# Patient Record
Sex: Male | Born: 1970 | Race: White | Hispanic: No | Marital: Married | Smoking: Current some day smoker
Health system: Southern US, Community
[De-identification: ages and names within clinical notes are randomized; demographics above are authoritative.]

## PROBLEM LIST (undated history)

## (undated) DIAGNOSIS — R011 Cardiac murmur, unspecified: Secondary | ICD-10-CM

## (undated) HISTORY — PX: HERNIA REPAIR: SHX51

## (undated) HISTORY — DX: Cardiac murmur, unspecified: R01.1

## (undated) HISTORY — PX: WISDOM TOOTH EXTRACTION: SHX21

---

## 2006-05-01 ENCOUNTER — Ambulatory Visit (HOSPITAL_BASED_OUTPATIENT_CLINIC_OR_DEPARTMENT_OTHER): Admission: RE | Admit: 2006-05-01 | Discharge: 2006-05-01 | Payer: Self-pay | Admitting: Surgery

## 2020-06-12 DIAGNOSIS — Z125 Encounter for screening for malignant neoplasm of prostate: Secondary | ICD-10-CM | POA: Diagnosis not present

## 2020-06-12 DIAGNOSIS — Z0000 Encounter for general adult medical examination without abnormal findings: Secondary | ICD-10-CM | POA: Diagnosis not present

## 2020-06-12 DIAGNOSIS — E785 Hyperlipidemia, unspecified: Secondary | ICD-10-CM | POA: Diagnosis not present

## 2020-06-19 DIAGNOSIS — N390 Urinary tract infection, site not specified: Secondary | ICD-10-CM | POA: Diagnosis not present

## 2020-06-19 DIAGNOSIS — Z1212 Encounter for screening for malignant neoplasm of rectum: Secondary | ICD-10-CM | POA: Diagnosis not present

## 2020-06-19 DIAGNOSIS — E785 Hyperlipidemia, unspecified: Secondary | ICD-10-CM | POA: Diagnosis not present

## 2020-06-19 DIAGNOSIS — Z0000 Encounter for general adult medical examination without abnormal findings: Secondary | ICD-10-CM | POA: Diagnosis not present

## 2020-06-19 DIAGNOSIS — R82998 Other abnormal findings in urine: Secondary | ICD-10-CM | POA: Diagnosis not present

## 2020-07-06 ENCOUNTER — Encounter: Payer: Self-pay | Admitting: Gastroenterology

## 2020-08-24 ENCOUNTER — Ambulatory Visit (AMBULATORY_SURGERY_CENTER): Payer: Self-pay | Admitting: *Deleted

## 2020-08-24 ENCOUNTER — Other Ambulatory Visit: Payer: Self-pay

## 2020-08-24 VITALS — Ht 71.0 in | Wt 187.0 lb

## 2020-08-24 DIAGNOSIS — Z1211 Encounter for screening for malignant neoplasm of colon: Secondary | ICD-10-CM

## 2020-08-24 MED ORDER — SUTAB 1479-225-188 MG PO TABS: 1.0000 | ORAL_TABLET | ORAL | 0 refills | Status: DC

## 2020-08-24 NOTE — Progress Notes (Signed)
Patient is here in-person for PV. Patient denies any allergies to eggs or soy. Patient denies any problems with anesthesia/sedation. Patient denies any oxygen use at home. Patient denies taking any diet/weight loss medications or blood thinners. Patient is not being treated for MRSA or C-diff. Patient is aware of our care-partner policy and LJQGB-20 safety protocol. EMMI education assisgned to the patient for the procedure, pt is aware.   COVID-19 vaccines completed on 01/2020, per patient.   Prep Prescription coupon given to the patient.

## 2020-08-28 ENCOUNTER — Encounter: Payer: Self-pay | Admitting: Gastroenterology

## 2020-09-07 ENCOUNTER — Encounter: Payer: Self-pay | Admitting: Gastroenterology

## 2020-09-24 ENCOUNTER — Other Ambulatory Visit: Payer: Self-pay | Admitting: Internal Medicine

## 2020-09-24 DIAGNOSIS — E785 Hyperlipidemia, unspecified: Secondary | ICD-10-CM

## 2020-09-28 ENCOUNTER — Ambulatory Visit (AMBULATORY_SURGERY_CENTER): Payer: BC Managed Care – PPO | Admitting: Gastroenterology

## 2020-09-28 ENCOUNTER — Encounter: Payer: Self-pay | Admitting: Gastroenterology

## 2020-09-28 ENCOUNTER — Other Ambulatory Visit: Payer: Self-pay

## 2020-09-28 VITALS — BP 126/78 | HR 54 | Temp 97.1°F | Resp 18 | Ht 71.0 in | Wt 187.0 lb

## 2020-09-28 DIAGNOSIS — Z1211 Encounter for screening for malignant neoplasm of colon: Secondary | ICD-10-CM | POA: Diagnosis not present

## 2020-09-28 DIAGNOSIS — D127 Benign neoplasm of rectosigmoid junction: Secondary | ICD-10-CM

## 2020-09-28 DIAGNOSIS — K635 Polyp of colon: Secondary | ICD-10-CM | POA: Diagnosis not present

## 2020-09-28 DIAGNOSIS — D123 Benign neoplasm of transverse colon: Secondary | ICD-10-CM

## 2020-09-28 MED ORDER — SODIUM CHLORIDE 0.9 % IV SOLN: 500.0000 mL | Freq: Once | INTRAVENOUS | Status: DC

## 2020-09-28 NOTE — Progress Notes (Signed)
Called to room to assist during endoscopic procedure.  Patient ID and intended procedure confirmed with present staff. Received instructions for my participation in the procedure from the performing physician.  

## 2020-09-28 NOTE — Progress Notes (Signed)
[facility]  Pt's states no medical or surgical changes since previsit or office visit.  

## 2020-09-28 NOTE — Op Note (Signed)
[place] Patient Name: Ronald Holden Procedure Date: 09/28/2020 1:56 PM MRN: [id number] Endoscopist: Remo Lipps P. Havery Moros , MD Age: 49 Referring MD:  Date of Birth: 1971/08/24 Gender: Male Account #: [id number] Procedure:                Colonoscopy Indications:              Screening for colorectal malignant neoplasm, This                            is the patient's first colonoscopy Medicines:                Monitored Anesthesia Care Procedure:                Pre-Anesthesia Assessment:                           - Prior to the procedure, a History and Physical                            was performed, and patient medications and                            allergies were reviewed. The patient's tolerance of                            previous anesthesia was also reviewed. The risks                            and benefits of the procedure and the sedation                            options and risks were discussed with the patient.                            All questions were answered, and informed consent                            was obtained. Prior Anticoagulants: The patient has                            taken no previous anticoagulant or antiplatelet                            agents. ASA Grade Assessment: I - A normal, healthy                            patient. After reviewing the risks and benefits,                            the patient was deemed in satisfactory condition to                            undergo the procedure.  After obtaining informed consent, the colonoscope                            was passed under direct vision. Throughout the                            procedure, the patient's blood pressure, pulse, and                            oxygen saturations were monitored continuously. The                            Colonoscope was introduced through the anus and                            advanced to the the cecum,  identified by                            appendiceal orifice and ileocecal valve. The                            colonoscopy was performed without difficulty. The                            patient tolerated the procedure well. The quality                            of the bowel preparation was adequate. The                            ileocecal valve, appendiceal orifice, and rectum                            were photographed. Scope In: 2:02:41 PM Scope Out: 2:30:10 PM Scope Withdrawal Time: 0 hours 18 minutes 10 seconds  Total Procedure Duration: 0 hours 27 minutes 29 seconds  Findings:                 The perianal and digital rectal examinations were                            normal.                           The colon was extremely tortuous with angulated                            turns in the left colon.                           Two flat polyps were found in the hepatic flexure.                            The polyps were 3 to 4 mm in size. These polyps  were removed with a cold snare. Resection and                            retrieval were complete.                           A 3 mm polyp was found in the transverse colon. The                            polyp was sessile. The polyp was removed with a                            cold snare. Resection and retrieval were complete.                           A 3 mm polyp was found in the recto-sigmoid colon.                            The polyp was sessile. The polyp was removed with a                            cold snare. Resection and retrieval were complete.                           Internal hemorrhoids were found during                            retroflexion. The hemorrhoids were small.                           The exam was otherwise without abnormality. Complications:            No immediate complications. Estimated blood loss:                            Minimal. Estimated Blood Loss:     Estimated  blood loss was minimal. Impression:               - Tortuous colon.                           - Two 3 to 4 mm polyps at the hepatic flexure,                            removed with a cold snare. Resected and retrieved.                           - One 3 mm polyp in the transverse colon, removed                            with a cold snare. Resected and retrieved.                           - One 3 mm polyp at the recto-sigmoid  colon,                            removed with a cold snare. Resected and retrieved.                           - Internal hemorrhoids.                           - The examination was otherwise normal. Recommendation:           - Patient has a contact number available for                            emergencies. The signs and symptoms of potential                            delayed complications were discussed with the                            patient. Return to normal activities tomorrow.                            Written discharge instructions were provided to the                            patient.                           - Resume previous diet.                           - Continue present medications.                           - Await pathology results. Remo Lipps P. Chales Pelissier, MD 09/28/2020 2:34:26 PM This report has been signed electronically.

## 2020-09-28 NOTE — Patient Instructions (Signed)
Information on polyps and hemorrhoids given to you today.  Await pathology results.  Resume previous diet and medications.  YOU HAD AN ENDOSCOPIC PROCEDURE TODAY AT [facility]:   Refer to the procedure report that was given to you for any specific questions about what was found during the examination.  If the procedure report does not answer your questions, please call your gastroenterologist to clarify.  If you requested that your care partner not be given the details of your procedure findings, then the procedure report has been included in a sealed envelope for you to review at your convenience later.  YOU SHOULD EXPECT: Some feelings of bloating in the abdomen. Passage of more gas than usual.  Walking can help get rid of the air that was put into your GI tract during the procedure and reduce the bloating. If you had a lower endoscopy (such as a colonoscopy or flexible sigmoidoscopy) you may notice spotting of blood in your stool or on the toilet paper. If you underwent a bowel prep for your procedure, you may not have a normal bowel movement for a few days.  Please Note:  You might notice some irritation and congestion in your nose or some drainage.  This is from the oxygen used during your procedure.  There is no need for concern and it should clear up in a day or so.  SYMPTOMS TO REPORT IMMEDIATELY:   Following lower endoscopy (colonoscopy or flexible sigmoidoscopy):  Excessive amounts of blood in the stool  Significant tenderness or worsening of abdominal pains  Swelling of the abdomen that is new, acute  Fever of 100F or higher  F For urgent or emergent issues, a gastroenterologist can be reached at any hour by calling [phone]. Do not use MyChart messaging for urgent concerns.    DIET:  We do recommend a small meal at first, but then you may proceed to your regular diet.  Drink plenty of fluids but you should avoid alcoholic beverages for 24  hours.  ACTIVITY:  You should plan to take it easy for the rest of today and you should NOT DRIVE or use heavy machinery until tomorrow (because of the sedation medicines used during the test).    FOLLOW UP: Our staff will call the number listed on your records 48-72 hours following your procedure to check on you and address any questions or concerns that you may have regarding the information given to you following your procedure. If we do not reach you, we will leave a message.  We will attempt to reach you two times.  During this call, we will ask if you have developed any symptoms of COVID 19. If you develop any symptoms (ie: fever, flu-like symptoms, shortness of breath, cough etc.) before then, please call [phone].  If you test positive for Covid 19 in the 2 weeks post procedure, please call and report this information to [place].    If any biopsies were taken you will be contacted by phone or by letter within the next 1-3 weeks.  Please call us at [phone] if you have not heard about the biopsies in 3 weeks.    SIGNATURES/CONFIDENTIALITY: You and/or your care partner have signed paperwork which will be entered into your electronic medical record.  These signatures attest to the fact that that the information above on your After Visit Summary has been reviewed and is understood.  Full responsibility of the confidentiality of this discharge information lies with you and/or  your care-partner. 

## 2020-09-28 NOTE — Progress Notes (Signed)
Report given to PACU, vss 

## 2020-09-30 ENCOUNTER — Telehealth: Payer: Self-pay

## 2020-09-30 ENCOUNTER — Telehealth: Payer: Self-pay | Admitting: *Deleted

## 2020-09-30 NOTE — Telephone Encounter (Signed)
First follow up call made. No opportunity to leave message as the mailbox is full.

## 2020-09-30 NOTE — Telephone Encounter (Signed)
  Follow up Call-  Call back number 09/28/2020  Post procedure Call Back phone  # [phone]  Permission to leave phone message Yes  Some recent data might be hidden     Patient questions:  Do you have a fever, pain , or abdominal swelling? No. Pain Score  0 *  Have you tolerated food without any problems? Yes.    Have you been able to return to your normal activities? Yes.    Do you have any questions about your discharge instructions: Diet   No. Medications  No. Follow up visit  No.  Do you have questions or concerns about your Care? No.  Actions: * If pain score is 4 or above: 1. No action needed, pain <4.Have you developed a fever since your procedure? no  2.   Have you had an respiratory symptoms (SOB or cough) since your procedure? no  3.   Have you tested positive for COVID 19 since your procedure no  4.   Have you had any family members/close contacts diagnosed with the COVID 19 since your procedure?  no   If yes to any of these questions please route to Joylene John, RN and Joella Prince, RN

## 2020-10-07 ENCOUNTER — Encounter: Payer: Self-pay | Admitting: Gastroenterology

## 2020-10-14 ENCOUNTER — Ambulatory Visit
Admission: RE | Admit: 2020-10-14 | Discharge: 2020-10-14 | Disposition: A | Discharge status: Home / Self Care | Payer: No Typology Code available for payment source | Source: Ambulatory Visit | Attending: Internal Medicine | Admitting: Internal Medicine

## 2020-10-14 DIAGNOSIS — E785 Hyperlipidemia, unspecified: Secondary | ICD-10-CM

## 2020-10-14 DIAGNOSIS — E7800 Pure hypercholesterolemia, unspecified: Secondary | ICD-10-CM | POA: Diagnosis not present

## 2020-10-28 DIAGNOSIS — D225 Melanocytic nevi of trunk: Secondary | ICD-10-CM | POA: Diagnosis not present

## 2020-10-28 DIAGNOSIS — D2361 Other benign neoplasm of skin of right upper limb, including shoulder: Secondary | ICD-10-CM | POA: Diagnosis not present

## 2020-10-28 DIAGNOSIS — D2262 Melanocytic nevi of left upper limb, including shoulder: Secondary | ICD-10-CM | POA: Diagnosis not present

## 2020-10-28 DIAGNOSIS — D2261 Melanocytic nevi of right upper limb, including shoulder: Secondary | ICD-10-CM | POA: Diagnosis not present

## 2021-02-15 DIAGNOSIS — F1120 Opioid dependence, uncomplicated: Secondary | ICD-10-CM | POA: Diagnosis not present

## 2021-03-03 DIAGNOSIS — F1120 Opioid dependence, uncomplicated: Secondary | ICD-10-CM | POA: Diagnosis not present

## 2021-03-23 DIAGNOSIS — F1120 Opioid dependence, uncomplicated: Secondary | ICD-10-CM | POA: Diagnosis not present

## 2021-04-20 DIAGNOSIS — F1120 Opioid dependence, uncomplicated: Secondary | ICD-10-CM | POA: Diagnosis not present

## 2021-04-27 DIAGNOSIS — F1120 Opioid dependence, uncomplicated: Secondary | ICD-10-CM | POA: Diagnosis not present

## 2021-05-04 DIAGNOSIS — F1120 Opioid dependence, uncomplicated: Secondary | ICD-10-CM | POA: Diagnosis not present

## 2021-05-11 DIAGNOSIS — F1120 Opioid dependence, uncomplicated: Secondary | ICD-10-CM | POA: Diagnosis not present

## 2021-06-15 DIAGNOSIS — Z125 Encounter for screening for malignant neoplasm of prostate: Secondary | ICD-10-CM | POA: Diagnosis not present

## 2021-06-15 DIAGNOSIS — E785 Hyperlipidemia, unspecified: Secondary | ICD-10-CM | POA: Diagnosis not present

## 2021-06-25 DIAGNOSIS — K30 Functional dyspepsia: Secondary | ICD-10-CM | POA: Diagnosis not present

## 2021-06-25 DIAGNOSIS — Z0000 Encounter for general adult medical examination without abnormal findings: Secondary | ICD-10-CM | POA: Diagnosis not present

## 2021-11-12 IMAGING — CT CT CARDIAC CORONARY ARTERY CALCIUM SCORE
4 series · 12 of 20 positions shown, 13 images · non-contrast
Comparison: None.

CLINICAL DATA: 49-year-old white male with high cholesterol

EXAM:
CT CARDIAC CORONARY ARTERY CALCIUM SCORE
TECHNIQUE: Non-contrast imaging through the heart was performed using
prospective ECG gating. Image post processing was performed on an
independent workstation, allowing for quantitative analysis of the
heart and coronary arteries. Note that this exam targets the heart
and the chest was not imaged in its entirety.

[Series 2: calcium scoring 2.00 qr36 bestdiast 72% hrt calciu · axial · 0.46mm/px · z∈[+1615,+1663]mm · 3 of 50 slices shown]
[im 13/50  vessel]
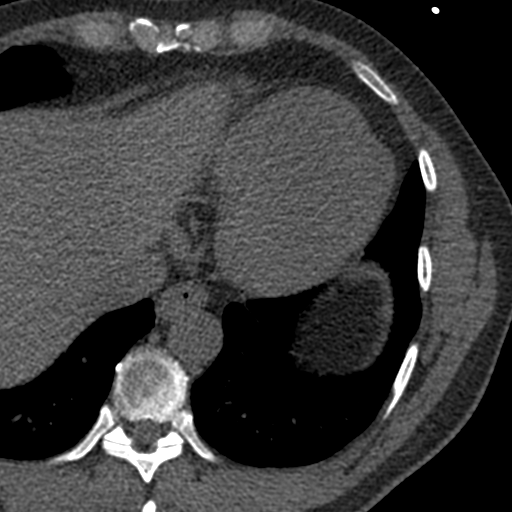
[im 25/50  vessel]
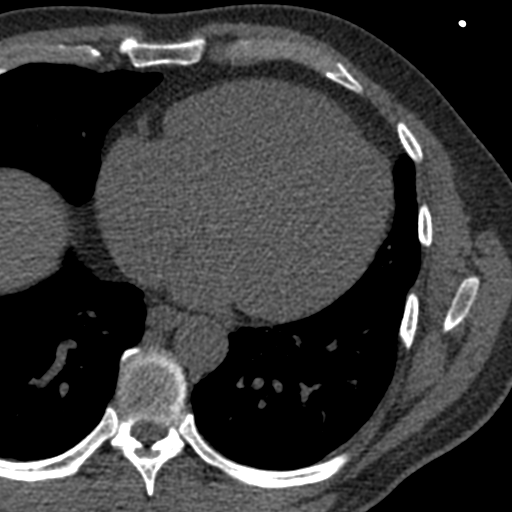
[im 37/50  vessel]
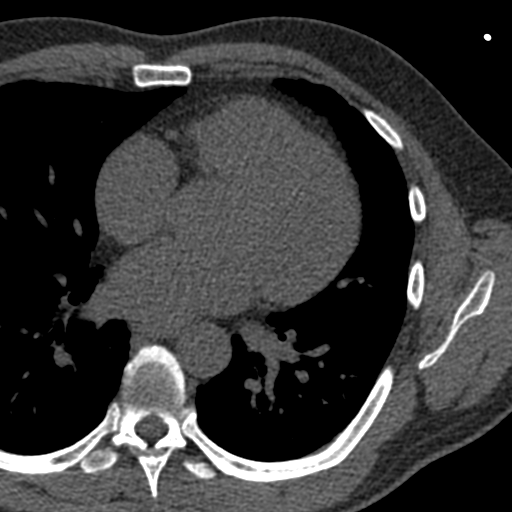

[Series 3: calcium scoring 2.00 qr36 bestdiast 68% hrt calciu · axial · 0.38mm/px · z∈[+1594,+1662]mm · 3 of 70 slices shown, 4 images]
[im 18/70  vessel]
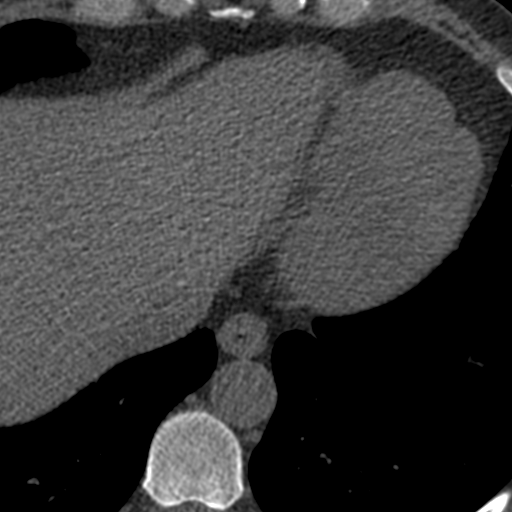
[im 18/70  lung]
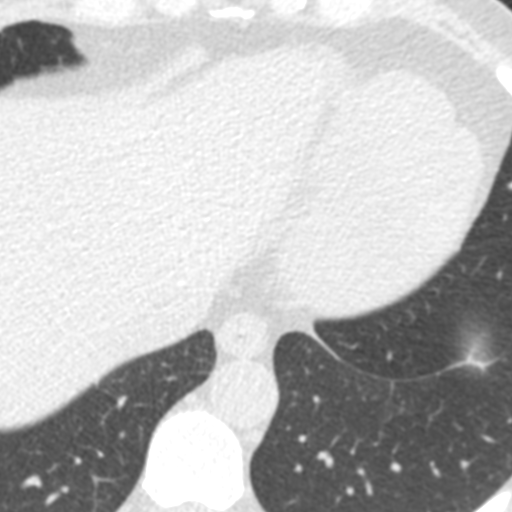
[im 35/70  vessel]
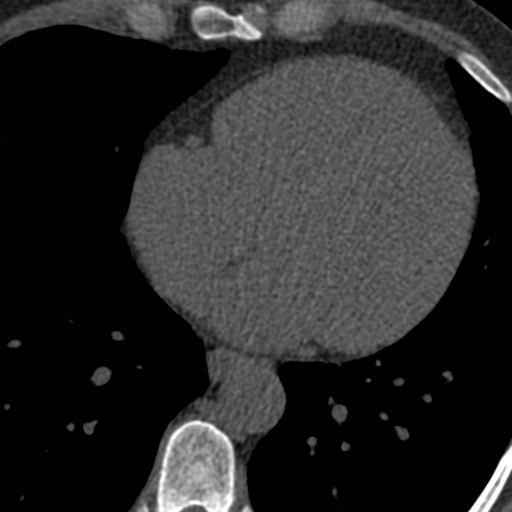
[im 52/70  vessel]
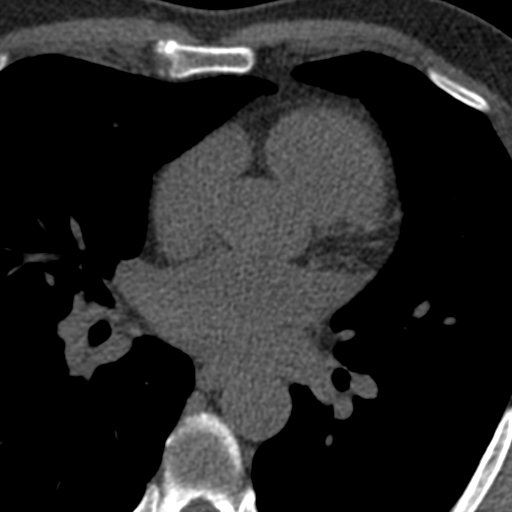

[Series 4: calcium scoring 2.00 br40 bestdiast 68% axial · axial · 0.59mm/px · z∈[+1594,+1662]mm · 3 of 70 slices shown]
[im 18/70  vessel]
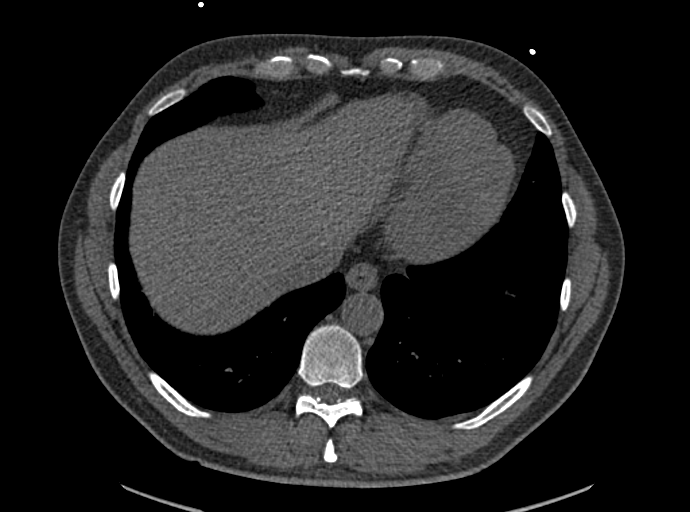
[im 35/70  vessel]
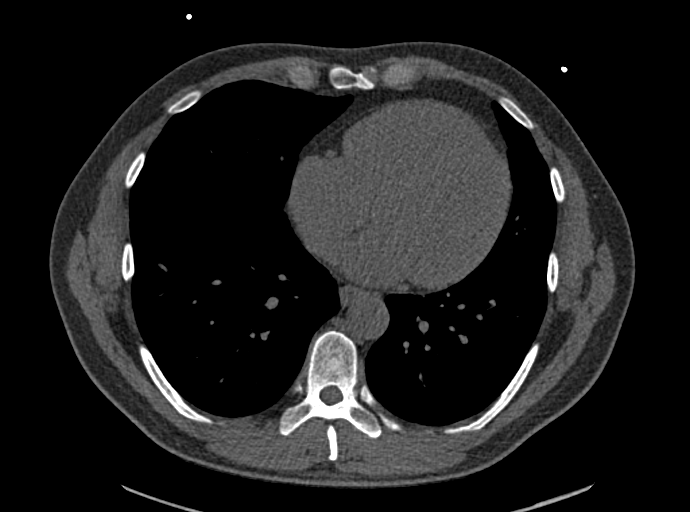
[im 52/70  vessel]
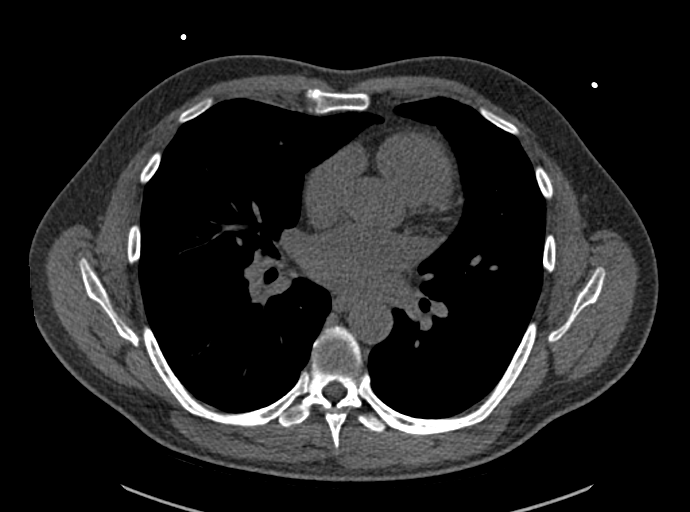

[Series 10: calcium scoring 2.00 br60 bestdiast 68% lungs · axial · 0.59mm/px · z∈[+1594,+1662]mm · 3 of 70 slices shown]
[im 18/70  vessel]
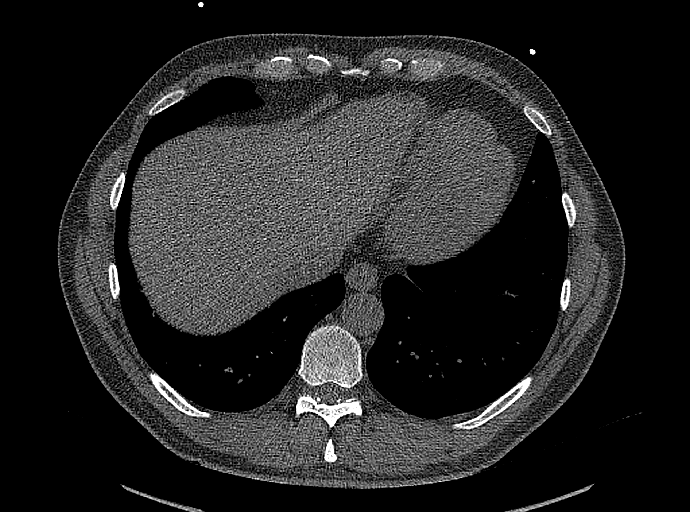
[im 35/70  vessel]
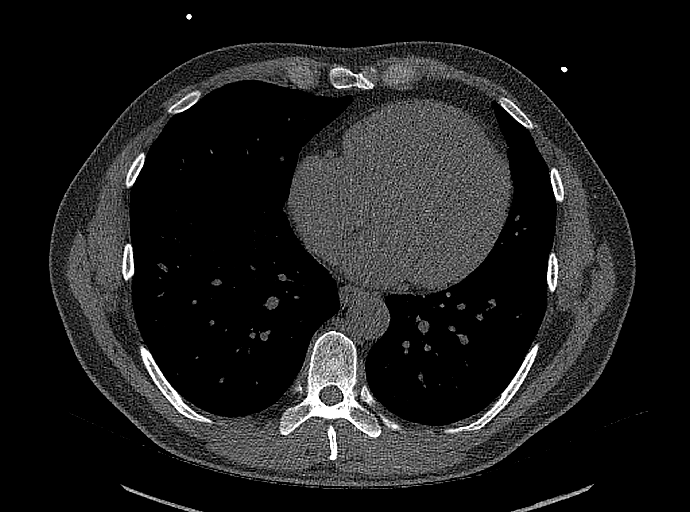
[im 52/70  vessel]
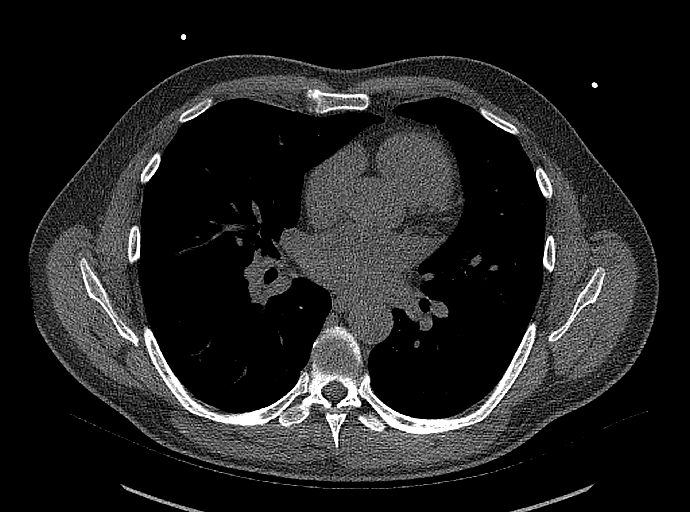

[12 of 20 positions shown; findings below may reference images not displayed]

FINDINGS: CORONARY CALCIUM SCORES:

Left Main: 0

LAD: 0

LCx: 0

RCA:

Total Agatston Score:

[HOSPITAL] percentile: 57

AORTA MEASUREMENTS:

Ascending Aorta: 28 mm

Descending Aorta: 25 mm

OTHER FINDINGS:

Cardiovascular: Coronary calcium as outlined above. No visible
aortic atherosclerosis. Central pulmonary vasculature is
unremarkable. Heart size top normal without pericardial effusion.

Mediastinum/Nodes: Imaged portions of the mediastinum without signs
of adenopathy. Esophagus grossly normal. Chest is incompletely
imaged on the current study.

Lungs/Pleura: Small granuloma in the RIGHT lung base with
calcification. Lungs are otherwise clear. Airways are patent. Chest
incompletely imaged.

Upper Abdomen: Incidental imaging of upper abdominal contents is
unremarkable.

Musculoskeletal: Spinal degenerative changes without acute or
destructive bone finding.
IMPRESSION: Coronary artery calcium score of 2.0. This places the patient in the
57th percentile for age, gender and race/ethnicity.
